# Patient Record
Sex: Female | Born: 1950 | Race: White | Hispanic: No | Marital: Married | Smoking: Never smoker
Health system: Southern US, Community
[De-identification: ages and names within clinical notes are randomized; demographics above are authoritative.]

## PROBLEM LIST (undated history)

## (undated) DIAGNOSIS — F419 Anxiety disorder, unspecified: Secondary | ICD-10-CM

## (undated) DIAGNOSIS — F32A Depression, unspecified: Secondary | ICD-10-CM

## (undated) DIAGNOSIS — T8859XA Other complications of anesthesia, initial encounter: Secondary | ICD-10-CM

## (undated) DIAGNOSIS — I10 Essential (primary) hypertension: Secondary | ICD-10-CM

## (undated) DIAGNOSIS — E7800 Pure hypercholesterolemia, unspecified: Secondary | ICD-10-CM

## (undated) DIAGNOSIS — T4145XA Adverse effect of unspecified anesthetic, initial encounter: Secondary | ICD-10-CM

## (undated) DIAGNOSIS — E039 Hypothyroidism, unspecified: Secondary | ICD-10-CM

## (undated) DIAGNOSIS — M1990 Unspecified osteoarthritis, unspecified site: Secondary | ICD-10-CM

## (undated) DIAGNOSIS — F329 Major depressive disorder, single episode, unspecified: Secondary | ICD-10-CM

## (undated) HISTORY — PX: CATARACT EXTRACTION: SUR2

---

## 2007-04-27 HISTORY — PX: JOINT REPLACEMENT: SHX530

## 2008-02-27 ENCOUNTER — Inpatient Hospital Stay (HOSPITAL_COMMUNITY): Admission: RE | Admit: 2008-02-27 | Discharge: 2008-02-29 | Payer: Self-pay | Admitting: Orthopedic Surgery

## 2010-09-08 NOTE — Op Note (Signed)
NAMETAMAKA, SAWIN               ACCOUNT NO.:  [id number]   MEDICAL RECORD NO.:  [id number]          PATIENT TYPE:  INP   LOCATION:  0002                         FACILITY:  [facility]   PHYSICIAN:  Madlyn Frankel. Charlann Boxer, M.D.  DATE OF BIRTH:  01-Jul-1950   DATE OF PROCEDURE:  02/27/2008  DATE OF DISCHARGE:                               OPERATIVE REPORT   PREOPERATIVE DIAGNOSIS:  Right hip osteoarthritis.   POSTOPERATIVE DIAGNOSIS:  Right hip osteoarthritis.   PROCEDURE:  Right total hip replacement.   COMPONENTS USED:  DePuy hip system, size 50 pinnacle cup, a 36 metal  liner, a 4 standard TriLock stem with a 36, 1.5 metal ball.   SURGEON:  Madlyn Frankel. Charlann Boxer, M.D.   ASSISTANT:  Dwyane Luo, PA.   ANESTHESIA:  General.   BLOOD LOSS:  500 mL.   SPECIMENS:  None.   DRAINS:  One medium Hemovac.   INDICATIONS FOR PROCEDURE:  Ms. Pha is a 60 year old female who  presented to the office with end-stage radiographic right hip arthritis  with projected pain to the right knee.  She had significant right medial  wall osteophytes with lateralization of the hip.  She had failed  conservative measures and at this point wished to discuss hip  replacement surgery.  The risks and benefits of the procedure were  discussed in detail.  Consent was obtained for right total hip  replacement.  The risks of infection, DVT, component failure and  dislocation were specifically outlined for hip replacement procedures.   PROCEDURE IN DETAIL:  The patient was brought to the operative theater.  Once adequate anesthesia, preoperative antibiotics, Ancef, 2 grams  administered, the patient was positioned in the left lateral decubitus  position with the right side up.  The right lower extremity was pre-  scrubbed and prepped and draped in a sterile fashion.  A lateral based  incision was made for a posterior approach to the hip.  The iliotibial  band and gluteal fascia were incised posteriorly.  The short  external  rotators were identified and taken down separate from the posterior  capsule.  An L capsulotomy was made preserving the posterior capsule to  protect the sciatic nerve from retractors within the repair at the end  of the case.  The hip was dislocated and neck osteotomy made based off  the anatomic landmarks of the center of the femoral head using a size 4  standard neck and ball.  Following the neck osteotomy, I attended to the  femur first using a box osteotome to remove some of the lateral neck,  then using the starting drill, I hand reamed it once and then irrigated  the canal to prevent fat emboli.  I began broaching with a 1 broach,  setting the anteversion at 20 degrees.  I then broached up to a size 4  and used the calcar planer to mill a little bit of the neck off  medially.   We then removed the broach and packed the femur with a sponge and  attended to the acetabulum.  Retractors were placed, the labrum removed,  and I began reaming with a 43 reamer.  I carried the reaming up to 49  reamer with good bony bed preparation.  The cup was irrigated and the  final 50 pinnacle cup was impacted at 35-40 degrees of abduction and 20  degrees of forward flexion, appearing anatomic within her cup anatomy  beneath the anterior wall anteriorly with some cup exposed  superolateral.   At this point, we placed a single cancellous screw, placed a central  hole eliminator and then impacted the final 36 metal liner.  I checked  my cup position and was happy with the overall position as noted.  Trial  reduction was now carried out with a 4 broach, standard neck.  At this  point, I was happy with the leg lengths, the amount of shuck in about a  mm with a 36, 1.5 ball.  All trial components were removed.  The final 4  standard stem was opened and impacted to the level where the broach was  and the final 36, 1.5 ball impacted onto a dry trunnion.  The hip was  reduced.  The hip was  irrigated throughout the case and again at this  point.  There was some oozing from the bone, but no significant soft  tissue oozing.  A medium Hemovac drain was placed deep.  I  reapproximated the posterior capsule with superior leaflet.  The  iliotibial band was reapproximated using #1 Vicryl.  The gluteal fascia  was closed with a #1 Vicryl running suture.  The remaining of the wound  was closed with 2-0 Vicryl and a 4-0 running Monocryl.  The hip was  cleaned, dried and dressed sterilely with a Mepilex dressing.  She was  brought to the recovery room in stable condition and tolerating the  procedure well.      Madlyn Frankel Charlann Boxer, M.D.  Electronically Signed     MDO/MEDQ  D:  02/27/2008  T:  02/27/2008  Job:  [id number]

## 2010-09-08 NOTE — H&P (Signed)
NAME:  Shelley Taylor, Shelley Taylor               ACCOUNT NO.:  [id number]   MEDICAL RECORD NO.:  [id number]          PATIENT TYPE:  INP   LOCATION:  NA                           FACILITY:  [facility]   PHYSICIAN:  Madlyn Frankel. Charlann Boxer, M.D.  DATE OF BIRTH:  03-14-1951   DATE OF ADMISSION:  02/27/2008  DATE OF DISCHARGE:                              HISTORY & PHYSICAL   PROCEDURE:  Right total hip replacement.   CHIEF COMPLAINT:  Right hip pain.   HISTORY OF PRESENT ILLNESS:  A 60 year old female with a history of  right hip pain secondary to osteoarthritis.  It has been refractory to  all conservative treatment.   PRIMARY CARE PHYSICIAN:  Dr. Joetta Manners.   PAST MEDICAL HISTORY:  Significant for:   1. Osteoarthritis.  2. Hyperlipidemia.  3. Cataracts.   PAST SURGICAL HISTORY:  None.   FAMILY MEDICAL HISTORY:  Noncontributory.   SOCIAL HISTORY:  She is married.  Has primary caregiver in the home  after surgery.   DRUG ALLERGIES:  PENICILLIN which just causes some mild itching.   MEDICATIONS:  1. Lisinopril HCTZ 20/12.5 mg 1 p.o. daily.  2. Pravachol 40 mg 1 p.o. daily.  3. Multivitamin daily.  4. Fish oil daily.   REVIEW OF SYSTEMS:  See HPI.   PHYSICAL EXAMINATION:  Pulse 72, respirations 18, blood pressure 126/74.  GENERAL:  Awake, alert, and oriented, well-developed, well-nourished, in  no acute distress.  NECK:  Supple.  There are no carotid bruits.  CHEST:  Lungs clear to auscultation bilaterally.  BREASTS:  Deferred.  HEART:  Regular rate and rhythm, S1, S2 distinct.  ABDOMEN:  Soft, nontender, nondistended, bowel sounds present.  GENITOURINARY:  Deferred.  EXTREMITIES:  Right hip has increased pain with decreased range of  motion.  SKIN:  No cellulitis.  NEUROLOGIC:  Intact distal sensibilities.   LABORATORY DATA:  EKG, chest x-ray all pending presurgical testing.   IMPRESSION:  Right hip osteoarthritis.   PLAN OF ACTION:  Right total hip replacement at [facility] on  February 27, 2008 by surgeon Dr. Lajoyce Corners.  Please see that date on  hospital  history and physical for further details.   Postoperative medication including Lovenox, Robaxin, iron, Colace, and  spironolactone were provided at time of history and physical.     ______________________________  Yetta Glassman. [address], [place]      Madlyn Frankel. Charlann Boxer, M.D.  Electronically Signed    BLM/MEDQ  D:  02/21/2008  T:  02/21/2008  Job:  [id number]   cc:   Raynelle Jan, M.D.  Fax: [phone]

## 2011-01-26 LAB — DIFFERENTIAL
Basophils Relative: 1
Eosinophils Absolute: 0.1
Eosinophils Relative: 2
Neutrophils Relative %: 53

## 2011-01-26 LAB — BASIC METABOLIC PANEL WITH GFR
BUN: 9
CO2: 26
Calcium: 7.7 — ABNORMAL LOW
Chloride: 108
Creatinine, Ser: 0.67
GFR calc Af Amer: >60
GFR calc non Af Amer: >60
Glucose, Bld: 105 — ABNORMAL HIGH
Potassium: 3.4 — ABNORMAL LOW
Sodium: 138

## 2011-01-26 LAB — PROTIME-INR: INR: 1

## 2011-01-26 LAB — URINALYSIS, ROUTINE W REFLEX MICROSCOPIC
Bilirubin Urine: NEGATIVE
Glucose, UA: NEGATIVE
Nitrite: NEGATIVE
Specific Gravity, Urine: 1.016
Urobilinogen, UA: 0.2
pH: 5.5

## 2011-01-26 LAB — CBC
HCT: 28.1 — ABNORMAL LOW
HCT: 29.1 — ABNORMAL LOW
Hemoglobin: 9.8 — ABNORMAL LOW
MCHC: 33.8
MCHC: 34.7
MCV: 95.2
MCV: 95.3
Platelets: 119 — ABNORMAL LOW
Platelets: 124 — ABNORMAL LOW
Platelets: 163
RBC: 3.05 — ABNORMAL LOW
RBC: 4.42
RDW: 12.7
WBC: 5.2

## 2011-01-26 LAB — ABO/RH: ABO/RH(D): B POS

## 2011-01-26 LAB — URINALYSIS, MICROSCOPIC ONLY
Bilirubin Urine: NEGATIVE
Ketones, ur: NEGATIVE
Leukocytes, UA: NEGATIVE

## 2011-01-26 LAB — BASIC METABOLIC PANEL
BUN: 11
BUN: 22
CO2: 26
Creatinine, Ser: 0.85
GFR calc Af Amer: >60
GFR calc non Af Amer: >60
Glucose, Bld: 117 — ABNORMAL HIGH
Potassium: 3.3 — ABNORMAL LOW

## 2011-01-26 LAB — TYPE AND SCREEN: ABO/RH(D): B POS

## 2011-01-26 LAB — URINE CULTURE: Colony Count: 9000

## 2011-01-26 LAB — APTT: aPTT: 25

## 2014-03-13 ENCOUNTER — Ambulatory Visit (HOSPITAL_COMMUNITY)
Admission: RE | Admit: 2014-03-13 | Discharge: 2014-03-13 | Disposition: A | Discharge status: Home / Self Care | Payer: BC Managed Care – PPO | Source: Ambulatory Visit | Attending: Anesthesiology | Admitting: Anesthesiology

## 2014-03-13 ENCOUNTER — Encounter (HOSPITAL_COMMUNITY): Payer: Self-pay

## 2014-03-13 ENCOUNTER — Encounter (HOSPITAL_COMMUNITY)
Admission: RE | Admit: 2014-03-13 | Discharge: 2014-03-13 | Disposition: A | Discharge status: Home / Self Care | Payer: BC Managed Care – PPO | Source: Ambulatory Visit | Attending: Orthopedic Surgery | Admitting: Orthopedic Surgery

## 2014-03-13 DIAGNOSIS — I10 Essential (primary) hypertension: Secondary | ICD-10-CM

## 2014-03-13 DIAGNOSIS — Z01818 Encounter for other preprocedural examination: Secondary | ICD-10-CM | POA: Insufficient documentation

## 2014-03-13 HISTORY — DX: Pure hypercholesterolemia, unspecified: E78.00

## 2014-03-13 HISTORY — DX: Major depressive disorder, single episode, unspecified: F32.9

## 2014-03-13 HISTORY — DX: Anxiety disorder, unspecified: F41.9

## 2014-03-13 HISTORY — DX: Essential (primary) hypertension: I10

## 2014-03-13 HISTORY — DX: Hypothyroidism, unspecified: E03.9

## 2014-03-13 HISTORY — DX: Adverse effect of unspecified anesthetic, initial encounter: T41.45XA

## 2014-03-13 HISTORY — DX: Unspecified osteoarthritis, unspecified site: M19.90

## 2014-03-13 HISTORY — DX: Other complications of anesthesia, initial encounter: T88.59XA

## 2014-03-13 HISTORY — DX: Depression, unspecified: F32.A

## 2014-03-13 LAB — BASIC METABOLIC PANEL
Anion gap: 11 (ref 5–15)
BUN: 11 mg/dL (ref 6–23)
CALCIUM: 9.7 mg/dL (ref 8.4–10.5)
CHLORIDE: 107 meq/L (ref 96–112)
CO2: 28 mEq/L (ref 19–32)
Creatinine, Ser: 0.64 mg/dL (ref 0.50–1.10)
GFR calc non Af Amer: >90 mL/min (ref >90)
Glucose, Bld: 95 mg/dL (ref 70–99)
Potassium: 4.3 mEq/L (ref 3.7–5.3)
Sodium: 146 mEq/L (ref 137–147)

## 2014-03-13 LAB — URINALYSIS, ROUTINE W REFLEX MICROSCOPIC
BILIRUBIN URINE: NEGATIVE
Glucose, UA: NEGATIVE mg/dL
Hgb urine dipstick: NEGATIVE
Ketones, ur: NEGATIVE mg/dL
Leukocytes, UA: NEGATIVE
Nitrite: NEGATIVE
PH: 7.5 (ref 5.0–8.0)
Protein, ur: NEGATIVE mg/dL
SPECIFIC GRAVITY, URINE: 1.006 (ref 1.005–1.030)
Urobilinogen, UA: 0.2 mg/dL (ref 0.0–1.0)

## 2014-03-13 LAB — SURGICAL PCR SCREEN
MRSA, PCR: NEGATIVE
STAPHYLOCOCCUS AUREUS: NEGATIVE

## 2014-03-13 LAB — PROTIME-INR
INR: 1.02 (ref 0.00–1.49)
Prothrombin Time: 13.5 seconds (ref 11.6–15.2)

## 2014-03-13 LAB — CBC
HCT: 41.4 % (ref 36.0–46.0)
HEMOGLOBIN: 13.7 g/dL (ref 12.0–15.0)
MCH: 30.9 pg (ref 26.0–34.0)
MCHC: 33.1 g/dL (ref 30.0–36.0)
MCV: 93.5 fL (ref 78.0–100.0)
Platelets: 178 10*3/uL (ref 150–400)
RBC: 4.43 MIL/uL (ref 3.87–5.11)
RDW: 12.7 % (ref 11.5–15.5)
WBC: 4.6 10*3/uL (ref 4.0–10.5)

## 2014-03-13 LAB — APTT: aPTT: 26 seconds (ref 24–37)

## 2014-03-13 NOTE — Progress Notes (Signed)
Surgery clearance note 02/25/14 Theo DillsJoyce Brown-Patrum NP on chart, LOV note 02/25/14 Theo DillsJoyce Brown-Patrum NP on chart, EKG 02/25/14 on chart

## 2014-03-13 NOTE — Patient Instructions (Addendum)
20 Shelley Taylor  03/13/2014   Your procedure is scheduled on: Tuesday 03/19/14  Report to [facility] at 06:00 AM.  Call this number if you have problems the morning of surgery [phone]   Remember:   Do not eat food or drink liquids After Midnight.     Take these medicines the morning of surgery with A SIP OF WATER: levothyroxine   Do not wear jewelry, make-up or nail polish.  Do not wear lotions, powders, or perfumes.   Do not shave 48 hours prior to surgery. Men may shave face and neck.  Do not bring valuables to the hospital.  Contacts, dentures or bridgework may not be worn into surgery.  Leave suitcase in the car. After surgery it may be brought to your room.  For patients admitted to the hospital, checkout time is 11:00 AM the day of discharge.   Please read over the following fact sheets that you were given: MRSA Information   [place] - Preparing for Surgery Before surgery, you can play an important role.  Because skin is not sterile, your skin needs to be as free of germs as possible.  You can reduce the number of germs on your skin by washing with CHG (chlorahexidine gluconate) soap before surgery.  CHG is an antiseptic cleaner which kills germs and bonds with the skin to continue killing germs even after washing. Please DO NOT use if you have an allergy to CHG or antibacterial soaps.  If your skin becomes reddened/irritated stop using the CHG and inform your nurse when you arrive at Short Stay. Do not shave (including legs and underarms) for at least 48 hours prior to the first CHG shower.  You may shave your face/neck. Please follow these instructions carefully:  1.  Shower with CHG Soap the night before surgery and the  morning of Surgery.  2.  If you choose to wash your hair, wash your hair first as usual with your  normal  shampoo.  3.  After you shampoo, rinse your hair and body thoroughly to remove the  shampoo.                             4.  Use CHG as you would any other liquid soap.  You can apply chg directly  to the skin and wash                       Gently with a scrungie or clean washcloth.  5.  Apply the CHG Soap to your body ONLY FROM THE NECK DOWN.   Do not use on face/ open                           Wound or open sores. Avoid contact with eyes, ears mouth and genitals (private parts).                       Wash face,  Genitals (private parts) with your normal soap.             6.  Wash thoroughly, paying special attention to the area where your surgery  will be performed.  7.  Thoroughly rinse your body with warm water from the neck down.  8.  DO NOT shower/wash with your normal soap after using and rinsing off  the CHG Soap.  9.  Pat yourself dry with a clean towel.            10.  Wear clean pajamas.            11.  Place clean sheets on your bed the night of your first shower and do not  sleep with pets. Day of Surgery : Do not apply any lotions/deodorants the morning of surgery.  Please wear clean clothes to the hospital/surgery center.  FAILURE TO FOLLOW THESE INSTRUCTIONS MAY RESULT IN THE CANCELLATION OF YOUR SURGERY PATIENT SIGNATURE_________________________________  NURSE SIGNATURE__________________________________  ________________________________________________________________________  WHAT IS A BLOOD TRANSFUSION? Blood Transfusion Information  A transfusion is the replacement of blood or some of its parts. Blood is made up of multiple cells which provide different functions.  Red blood cells carry oxygen and are used for blood loss replacement.  White blood cells fight against infection.  Platelets control bleeding.  Plasma helps clot blood.  Other blood products are available for specialized needs, such as hemophilia or other clotting disorders. BEFORE THE TRANSFUSION  Who gives blood for transfusions?   Healthy volunteers who are fully evaluated to make sure their blood  is safe. This is blood bank blood. Transfusion therapy is the safest it has ever been in the practice of medicine. Before blood is taken from a donor, a complete history is taken to make sure that person has no history of diseases nor engages in risky social behavior (examples are intravenous drug use or sexual activity with multiple partners). The donor's travel history is screened to minimize risk of transmitting infections, such as malaria. The donated blood is tested for signs of infectious diseases, such as HIV and hepatitis. The blood is then tested to be sure it is compatible with you in order to minimize the chance of a transfusion reaction. If you or a relative donates blood, this is often done in anticipation of surgery and is not appropriate for emergency situations. It takes many days to process the donated blood. RISKS AND COMPLICATIONS Although transfusion therapy is very safe and saves many lives, the main dangers of transfusion include:  1. Getting an infectious disease. 2. Developing a transfusion reaction. This is an allergic reaction to something in the blood you were given. Every precaution is taken to prevent this. The decision to have a blood transfusion has been considered carefully by your caregiver before blood is given. Blood is not given unless the benefits outweigh the risks. AFTER THE TRANSFUSION  Right after receiving a blood transfusion, you will usually feel much better and more energetic. This is especially true if your red blood cells have gotten low (anemic). The transfusion raises the level of the red blood cells which carry oxygen, and this usually causes an energy increase.  The nurse administering the transfusion will monitor you carefully for complications. HOME CARE INSTRUCTIONS  No special instructions are needed after a transfusion. You may find your energy is better. Speak with your caregiver about any limitations on activity for underlying diseases you may  have. SEEK MEDICAL CARE IF:   Your condition is not improving after your transfusion.  You develop redness or irritation at the intravenous (IV) site. SEEK IMMEDIATE MEDICAL CARE IF:  Any of the following symptoms occur over the next 12 hours:  Shaking chills.  You have a temperature by mouth above 102 F (38.9 C), not controlled by medicine.  Chest, back, or muscle pain.  People around you feel you are not acting correctly or  are confused.  Shortness of breath or difficulty breathing.  Dizziness and fainting.  You get a rash or develop hives.  You have a decrease in urine output.  Your urine turns a dark color or changes to pink, red, or brown. Any of the following symptoms occur over the next 10 days:  You have a temperature by mouth above 102 F (38.9 C), not controlled by medicine.  Shortness of breath.  Weakness after normal activity.  The white part of the eye turns yellow (jaundice).  You have a decrease in the amount of urine or are urinating less often.  Your urine turns a dark color or changes to pink, red, or brown. Document Released: 04/09/2000 Document Revised: 07/05/2011 Document Reviewed: 11/27/2007 [employer] Patient Information 2014 [employer] [place].  _______________________________________________________________________  Incentive Spirometer  An incentive spirometer is a tool that can help keep your lungs clear and active. This tool measures how well you are filling your lungs with each breath. Taking long deep breaths may help reverse or decrease the chance of developing breathing (pulmonary) problems (especially infection) following:  A long period of time when you are unable to move or be active. BEFORE THE PROCEDURE   If the spirometer includes an indicator to show your best effort, your nurse or respiratory therapist will set it to a desired goal.  If possible, sit up straight or lean slightly forward. Try not to slouch.  Hold the  incentive spirometer in an upright position. INSTRUCTIONS FOR USE  3. Sit on the edge of your bed if possible, or sit up as far as you can in bed or on a chair. 4. Hold the incentive spirometer in an upright position. 5. Breathe out normally. 6. Place the mouthpiece in your mouth and seal your lips tightly around it. 7. Breathe in slowly and as deeply as possible, raising the piston or the ball toward the top of the column. 8. Hold your breath for 3-5 seconds or for as long as possible. Allow the piston or ball to fall to the bottom of the column. 9. Remove the mouthpiece from your mouth and breathe out normally. 10. Rest for a few seconds and repeat Steps 1 through 7 at least 10 times every 1-2 hours when you are awake. Take your time and take a few normal breaths between deep breaths. 11. The spirometer may include an indicator to show your best effort. Use the indicator as a goal to work toward during each repetition. 12. After each set of 10 deep breaths, practice coughing to be sure your lungs are clear. If you have an incision (the cut made at the time of surgery), support your incision when coughing by placing a pillow or rolled up towels firmly against it. Once you are able to get out of bed, walk around indoors and cough well. You may stop using the incentive spirometer when instructed by your caregiver.  RISKS AND COMPLICATIONS  Take your time so you do not get dizzy or light-headed.  If you are in pain, you may need to take or ask for pain medication before doing incentive spirometry. It is harder to take a deep breath if you are having pain. AFTER USE  Rest and breathe slowly and easily.  It can be helpful to keep track of a log of your progress. Your caregiver can provide you with a simple table to help with this. If you are using the spirometer at home, follow these instructions: SEEK MEDICAL CARE IF:  You are having difficultly using the spirometer.  You have trouble using  the spirometer as often as instructed.  Your pain medication is not giving enough relief while using the spirometer.  You develop fever of 100.5 F (38.1 C) or higher. SEEK IMMEDIATE MEDICAL CARE IF:   You cough up bloody sputum that had not been present before.  You develop fever of 102 F (38.9 C) or greater.  You develop worsening pain at or near the incision site. MAKE SURE YOU:   Understand these instructions.  Will watch your condition.  Will get help right away if you are not doing well or get worse. Document Released: 08/23/2006 Document Revised: 07/05/2011 Document Reviewed: 10/24/2006 [facility] Patient Information 2014 [place], [place].   ________________________________________________________________________

## 2014-03-17 NOTE — H&P (Signed)
TOTAL HIP ADMISSION H&P  Patient is admitted for left total hip arthroplasty, anterior approach.  Subjective:  Chief Complaint:     Left hip primary OA / pain  HPI: Shelley Taylor, 63 y.o. female, has a history of pain and functional disability in the left hip(s) due to arthritis and patient has failed non-surgical conservative treatments for greater than 12 weeks to include NSAID's and/or analgesics, corticosteriod injections and activity modification.  Onset of symptoms was gradual starting < 1 year ago with gradually worsening course since that time.The patient noted prior procedures of the hip to include arthroplasty on the right hip per Dr. Charlann Boxerlin in 2009.  Patient currently rates pain in the left hip at 10 out of 10 with activity. Patient has night pain, worsening of pain with activity and weight bearing, trendelenberg gait, pain that interfers with activities of daily living and pain with passive range of motion. Patient has evidence of periarticular osteophytes and joint space narrowing by imaging studies. This condition presents safety issues increasing the risk of falls.   There is no current active infection.  Risks, benefits and expectations were discussed with the patient.  Risks including but not limited to the risk of anesthesia, blood clots, nerve damage, blood vessel damage, failure of the prosthesis, infection and up to and including death.  Patient understand the risks, benefits and expectations and wishes to proceed with surgery.   PCP: No primary care provider on file.  D/C Plans:      Home with HHPT  Post-op Meds:       No Rx given  Tranexamic Acid:      To be given - IV    Decadron:      Is to be given  FYI:     ASA post-op  Norco post-op    Past Medical History  Diagnosis Date  . Hypertension     "white coat syndrome from one HTN episode in 2009, bottomed out after hip surgery 2009"  . Hypercholesteremia     under control  . Hypothyroidism   . Anxiety    occasional  . Depression     occasional-seasonal  . Arthritis   . Complication of anesthesia     blood pressure dropped-maybe from pain medications or anesthesia    Past Surgical History  Procedure Laterality Date  . Joint replacement Right 2009    hip  . Cataract extraction Bilateral Oct, Dec 1997    No prescriptions prior to admission   Allergies  Allergen Reactions  . Amoxicillin Swelling  . Septra [Sulfamethoxazole-Trimethoprim] Swelling    History  Substance Use Topics  . Smoking status: Never Smoker   . Smokeless tobacco: Never Used  . Alcohol Use: No    No family history on file.   Review of Systems  Constitutional: Negative.   HENT: Negative.   Eyes: Negative.   Respiratory: Negative.   Cardiovascular: Negative.   Gastrointestinal: Positive for constipation.  Genitourinary: Negative.   Musculoskeletal: Positive for joint pain.  Skin: Negative.   Neurological: Negative.   Endo/Heme/Allergies: Negative.   Psychiatric/Behavioral: Positive for depression. The patient is nervous/anxious.     Objective:  Physical Exam  Constitutional: She is oriented to person, place, and time. She appears well-developed and well-nourished.  HENT:  Head: Normocephalic and atraumatic.  Eyes: Pupils are equal, round, and reactive to light.  Neck: Neck supple. No JVD present. No tracheal deviation present. No thyromegaly present.  Cardiovascular: Normal rate, regular rhythm, normal heart sounds and  intact distal pulses.   Respiratory: Effort normal and breath sounds normal. No stridor. No respiratory distress. She has no wheezes.  GI: Soft. There is no tenderness. There is no guarding.  Musculoskeletal:       Left hip: She exhibits decreased range of motion, decreased strength, tenderness and bony tenderness. She exhibits no swelling, no deformity and no laceration.  Lymphadenopathy:    She has no cervical adenopathy.  Neurological: She is alert and oriented to person, place,  and time.  Skin: Skin is warm and dry.  Psychiatric: She has a normal mood and affect.      Imaging Review Plain radiographs demonstrate severe degenerative joint disease of the left hip(s). The bone quality appears to be good for age and reported activity level.  Assessment/Plan:  End stage arthritis, left hip(s)  The patient history, physical examination, clinical judgement of the provider and imaging studies are consistent with end stage degenerative joint disease of the left hip(s) and total hip arthroplasty is deemed medically necessary. The treatment options including medical management, injection therapy, arthroscopy and arthroplasty were discussed at length. The risks and benefits of total hip arthroplasty were presented and reviewed. The risks due to aseptic loosening, infection, stiffness, dislocation/subluxation,  thromboembolic complications and other imponderables were discussed.  The patient acknowledged the explanation, agreed to proceed with the plan and consent was signed. Patient is being admitted for inpatient treatment for surgery, pain control, PT, OT, prophylactic antibiotics, VTE prophylaxis, progressive ambulation and ADL's and discharge planning.The patient is planning to be discharged home with home health services.      Anastasio AuerbachMatthew S. Shaquelle Hernon   PA-C  03/17/2014, 10:33 PM

## 2014-03-19 ENCOUNTER — Inpatient Hospital Stay (HOSPITAL_COMMUNITY): Payer: BC Managed Care – PPO

## 2014-03-19 ENCOUNTER — Inpatient Hospital Stay (HOSPITAL_COMMUNITY): Payer: BC Managed Care – PPO | Admitting: Anesthesiology

## 2014-03-19 ENCOUNTER — Encounter (HOSPITAL_COMMUNITY)
Admission: RE | Disposition: A | Discharge status: Home Health | Payer: Self-pay | Source: Ambulatory Visit | Attending: Orthopedic Surgery

## 2014-03-19 ENCOUNTER — Encounter (HOSPITAL_COMMUNITY): Payer: Self-pay | Admitting: *Deleted

## 2014-03-19 ENCOUNTER — Inpatient Hospital Stay (HOSPITAL_COMMUNITY)
Admission: RE | Admit: 2014-03-19 | Discharge: 2014-03-20 | DRG: 470 | Disposition: A | Discharge status: Home Health | Payer: BC Managed Care – PPO | Source: Ambulatory Visit | Attending: Orthopedic Surgery | Admitting: Orthopedic Surgery

## 2014-03-19 DIAGNOSIS — Z6841 Body Mass Index (BMI) 40.0 and over, adult: Secondary | ICD-10-CM | POA: Diagnosis not present

## 2014-03-19 DIAGNOSIS — M25552 Pain in left hip: Secondary | ICD-10-CM | POA: Diagnosis present

## 2014-03-19 DIAGNOSIS — E039 Hypothyroidism, unspecified: Secondary | ICD-10-CM | POA: Diagnosis present

## 2014-03-19 DIAGNOSIS — Z96649 Presence of unspecified artificial hip joint: Secondary | ICD-10-CM

## 2014-03-19 DIAGNOSIS — M1612 Unilateral primary osteoarthritis, left hip: Principal | ICD-10-CM | POA: Diagnosis present

## 2014-03-19 DIAGNOSIS — Z96641 Presence of right artificial hip joint: Secondary | ICD-10-CM | POA: Diagnosis present

## 2014-03-19 DIAGNOSIS — I10 Essential (primary) hypertension: Secondary | ICD-10-CM | POA: Diagnosis present

## 2014-03-19 DIAGNOSIS — E780 Pure hypercholesterolemia: Secondary | ICD-10-CM | POA: Diagnosis present

## 2014-03-19 HISTORY — PX: TOTAL HIP ARTHROPLASTY: SHX124

## 2014-03-19 LAB — TYPE AND SCREEN
ABO/RH(D): B POS
ANTIBODY SCREEN: NEGATIVE

## 2014-03-19 SURGERY — ARTHROPLASTY, HIP, TOTAL, ANTERIOR APPROACH
Anesthesia: Spinal | Site: Hip | Laterality: Left

## 2014-03-19 MED ORDER — METHOCARBAMOL 500 MG PO TABS
500.0000 mg | ORAL_TABLET | Freq: Four times a day (QID) | ORAL | Status: DC | PRN
  Administered 2014-03-19 – 2014-03-20 (×3): 500 mg via ORAL
  Filled 2014-03-19 (×3): qty 1

## 2014-03-19 MED ORDER — ASPIRIN EC 325 MG PO TBEC
325.0000 mg | DELAYED_RELEASE_TABLET | Freq: Two times a day (BID) | ORAL | Status: DC
  Administered 2014-03-20: 325 mg via ORAL
  Filled 2014-03-19 (×3): qty 1

## 2014-03-19 MED ORDER — LIDOCAINE HCL (CARDIAC) 20 MG/ML IV SOLN
INTRAVENOUS | Status: AC
  Filled 2014-03-19: qty 5

## 2014-03-19 MED ORDER — DEXAMETHASONE SODIUM PHOSPHATE 10 MG/ML IJ SOLN
10.0000 mg | Freq: Once | INTRAMUSCULAR | Status: AC
  Administered 2014-03-20: 10 mg via INTRAVENOUS
  Filled 2014-03-19: qty 1

## 2014-03-19 MED ORDER — ONDANSETRON HCL 4 MG/2ML IJ SOLN
INTRAMUSCULAR | Status: DC | PRN
  Administered 2014-03-19: 4 mg via INTRAVENOUS

## 2014-03-19 MED ORDER — DIPHENHYDRAMINE HCL 25 MG PO CAPS: 25.0000 mg | ORAL_CAPSULE | Freq: Four times a day (QID) | ORAL | Status: DC | PRN

## 2014-03-19 MED ORDER — PROPOFOL INFUSION 10 MG/ML OPTIME
INTRAVENOUS | Status: DC | PRN
  Administered 2014-03-19: 100 ug/kg/min via INTRAVENOUS

## 2014-03-19 MED ORDER — HYDROMORPHONE HCL 1 MG/ML IJ SOLN
INTRAMUSCULAR | Status: AC
  Administered 2014-03-19: 0.5 mg via INTRAVENOUS
  Filled 2014-03-19: qty 1

## 2014-03-19 MED ORDER — METOCLOPRAMIDE HCL 10 MG PO TABS: 5.0000 mg | ORAL_TABLET | Freq: Three times a day (TID) | ORAL | Status: DC | PRN

## 2014-03-19 MED ORDER — POTASSIUM CHLORIDE 2 MEQ/ML IV SOLN
100.0000 mL/h | INTRAVENOUS | Status: DC
  Administered 2014-03-19 (×2): 100 mL/h via INTRAVENOUS
  Filled 2014-03-19 (×9): qty 1000

## 2014-03-19 MED ORDER — ONDANSETRON HCL 4 MG/2ML IJ SOLN: 4.0000 mg | Freq: Four times a day (QID) | INTRAMUSCULAR | Status: DC | PRN

## 2014-03-19 MED ORDER — CEFAZOLIN SODIUM-DEXTROSE 2-3 GM-% IV SOLR
INTRAVENOUS | Status: AC
  Filled 2014-03-19: qty 50

## 2014-03-19 MED ORDER — BISACODYL 10 MG RE SUPP: 10.0000 mg | Freq: Every day | RECTAL | Status: DC | PRN

## 2014-03-19 MED ORDER — DOCUSATE SODIUM 100 MG PO CAPS
100.0000 mg | ORAL_CAPSULE | Freq: Two times a day (BID) | ORAL | Status: DC
  Administered 2014-03-19 – 2014-03-20 (×2): 100 mg via ORAL

## 2014-03-19 MED ORDER — LACTATED RINGERS IV SOLN
INTRAVENOUS | Status: DC | PRN
  Administered 2014-03-19 (×3): via INTRAVENOUS

## 2014-03-19 MED ORDER — MAGNESIUM CITRATE PO SOLN: 1.0000 | Freq: Once | ORAL | Status: AC | PRN

## 2014-03-19 MED ORDER — DEXAMETHASONE SODIUM PHOSPHATE 10 MG/ML IJ SOLN
INTRAMUSCULAR | Status: AC
  Filled 2014-03-19: qty 1

## 2014-03-19 MED ORDER — PHENYLEPHRINE HCL 10 MG/ML IJ SOLN
INTRAMUSCULAR | Status: AC
  Filled 2014-03-19: qty 1

## 2014-03-19 MED ORDER — PROMETHAZINE HCL 25 MG/ML IJ SOLN: 6.2500 mg | INTRAMUSCULAR | Status: DC | PRN

## 2014-03-19 MED ORDER — LACTATED RINGERS IV SOLN
INTRAVENOUS | Status: DC
  Administered 2014-03-19: 1000 mL via INTRAVENOUS

## 2014-03-19 MED ORDER — PROPOFOL 10 MG/ML IV EMUL
INTRAVENOUS | Status: DC | PRN
  Administered 2014-03-19: 20 mg via INTRAVENOUS

## 2014-03-19 MED ORDER — MIDAZOLAM HCL 5 MG/5ML IJ SOLN
INTRAMUSCULAR | Status: DC | PRN
  Administered 2014-03-19: 2 mg via INTRAVENOUS

## 2014-03-19 MED ORDER — CEFAZOLIN SODIUM-DEXTROSE 2-3 GM-% IV SOLR
2.0000 g | INTRAVENOUS | Status: AC
  Administered 2014-03-19: 2 g via INTRAVENOUS

## 2014-03-19 MED ORDER — METOCLOPRAMIDE HCL 5 MG/ML IJ SOLN: 5.0000 mg | Freq: Three times a day (TID) | INTRAMUSCULAR | Status: DC | PRN

## 2014-03-19 MED ORDER — MIDAZOLAM HCL 2 MG/2ML IJ SOLN
INTRAMUSCULAR | Status: AC
  Filled 2014-03-19: qty 2

## 2014-03-19 MED ORDER — ONDANSETRON HCL 4 MG/2ML IJ SOLN
INTRAMUSCULAR | Status: AC
  Filled 2014-03-19: qty 2

## 2014-03-19 MED ORDER — PROPOFOL 10 MG/ML IV BOLUS
INTRAVENOUS | Status: AC
  Filled 2014-03-19: qty 20

## 2014-03-19 MED ORDER — KETOROLAC TROMETHAMINE 15 MG/ML IJ SOLN
7.5000 mg | Freq: Four times a day (QID) | INTRAMUSCULAR | Status: DC
  Administered 2014-03-19 (×2): 7.5 mg via INTRAVENOUS
  Filled 2014-03-19 (×2): qty 1

## 2014-03-19 MED ORDER — HYDROMORPHONE HCL 1 MG/ML IJ SOLN
0.2500 mg | INTRAMUSCULAR | Status: DC | PRN
  Administered 2014-03-19 (×2): 0.5 mg via INTRAVENOUS

## 2014-03-19 MED ORDER — EPHEDRINE SULFATE 50 MG/ML IJ SOLN
INTRAMUSCULAR | Status: AC
  Filled 2014-03-19: qty 1

## 2014-03-19 MED ORDER — PHENOL 1.4 % MT LIQD: 1.0000 | OROMUCOSAL | Status: DC | PRN

## 2014-03-19 MED ORDER — TRANEXAMIC ACID 100 MG/ML IV SOLN
1000.0000 mg | Freq: Once | INTRAVENOUS | Status: AC
  Administered 2014-03-19: 1000 mg via INTRAVENOUS
  Filled 2014-03-19: qty 10

## 2014-03-19 MED ORDER — LEVOTHYROXINE SODIUM 50 MCG PO TABS
50.0000 ug | ORAL_TABLET | Freq: Every day | ORAL | Status: DC
  Administered 2014-03-20: 50 ug via ORAL
  Filled 2014-03-19 (×2): qty 1

## 2014-03-19 MED ORDER — ALUM & MAG HYDROXIDE-SIMETH 200-200-20 MG/5ML PO SUSP: 30.0000 mL | ORAL | Status: DC | PRN

## 2014-03-19 MED ORDER — HYDROCODONE-ACETAMINOPHEN 7.5-325 MG PO TABS
1.0000 | ORAL_TABLET | ORAL | Status: DC
  Administered 2014-03-19: 2 via ORAL
  Administered 2014-03-19: 1 via ORAL
  Administered 2014-03-19 – 2014-03-20 (×5): 2 via ORAL
  Filled 2014-03-19: qty 2
  Filled 2014-03-19: qty 1
  Filled 2014-03-19 (×5): qty 2

## 2014-03-19 MED ORDER — MENTHOL 3 MG MT LOZG
1.0000 | LOZENGE | OROMUCOSAL | Status: DC | PRN
  Filled 2014-03-19: qty 9

## 2014-03-19 MED ORDER — 0.9 % SODIUM CHLORIDE (POUR BTL) OPTIME
TOPICAL | Status: DC | PRN
  Administered 2014-03-19: 1000 mL

## 2014-03-19 MED ORDER — BUPIVACAINE IN DEXTROSE 0.75-8.25 % IT SOLN
INTRATHECAL | Status: DC | PRN
  Administered 2014-03-19: 2 mL via INTRATHECAL

## 2014-03-19 MED ORDER — ONDANSETRON HCL 4 MG PO TABS: 4.0000 mg | ORAL_TABLET | Freq: Four times a day (QID) | ORAL | Status: DC | PRN

## 2014-03-19 MED ORDER — CEFAZOLIN SODIUM-DEXTROSE 2-3 GM-% IV SOLR
2.0000 g | Freq: Four times a day (QID) | INTRAVENOUS | Status: AC
  Administered 2014-03-19 (×2): 2 g via INTRAVENOUS
  Filled 2014-03-19 (×2): qty 50

## 2014-03-19 MED ORDER — DEXAMETHASONE SODIUM PHOSPHATE 10 MG/ML IJ SOLN
10.0000 mg | Freq: Once | INTRAMUSCULAR | Status: AC
  Administered 2014-03-19: 10 mg via INTRAVENOUS

## 2014-03-19 MED ORDER — HYDROMORPHONE HCL 1 MG/ML IJ SOLN
0.5000 mg | INTRAMUSCULAR | Status: DC | PRN
  Administered 2014-03-19: 1 mg via INTRAVENOUS
  Administered 2014-03-19 (×2): 0.5 mg via INTRAVENOUS
  Filled 2014-03-19 (×3): qty 1

## 2014-03-19 MED ORDER — CITALOPRAM HYDROBROMIDE 10 MG PO TABS
10.0000 mg | ORAL_TABLET | Freq: Every evening | ORAL | Status: DC
  Administered 2014-03-19: 10 mg via ORAL
  Filled 2014-03-19 (×2): qty 1

## 2014-03-19 MED ORDER — PRAVASTATIN SODIUM 40 MG PO TABS
40.0000 mg | ORAL_TABLET | Freq: Every day | ORAL | Status: DC
  Administered 2014-03-19: 40 mg via ORAL
  Filled 2014-03-19 (×2): qty 1

## 2014-03-19 MED ORDER — METHOCARBAMOL 1000 MG/10ML IJ SOLN
500.0000 mg | Freq: Four times a day (QID) | INTRAVENOUS | Status: DC | PRN
  Administered 2014-03-19: 500 mg via INTRAVENOUS
  Filled 2014-03-19 (×2): qty 5

## 2014-03-19 MED ORDER — POLYETHYLENE GLYCOL 3350 17 G PO PACK
17.0000 g | PACK | Freq: Two times a day (BID) | ORAL | Status: DC
  Administered 2014-03-20: 17 g via ORAL

## 2014-03-19 MED ORDER — PHENYLEPHRINE HCL 10 MG/ML IJ SOLN
10.0000 mg | INTRAVENOUS | Status: DC | PRN
  Administered 2014-03-19: 10 ug/min via INTRAVENOUS

## 2014-03-19 MED ORDER — FERROUS SULFATE 325 (65 FE) MG PO TABS
325.0000 mg | ORAL_TABLET | Freq: Three times a day (TID) | ORAL | Status: DC
  Administered 2014-03-20: 325 mg via ORAL
  Filled 2014-03-19 (×5): qty 1

## 2014-03-19 MED ORDER — SODIUM CHLORIDE 0.9 % IJ SOLN
INTRAMUSCULAR | Status: AC
  Filled 2014-03-19: qty 10

## 2014-03-19 SURGICAL SUPPLY — 38 items
BAG ZIPLOCK 12X15 (MISCELLANEOUS) IMPLANT
CAPT HIP PF COP ×3 IMPLANT
COVER PERINEAL POST (MISCELLANEOUS) ×3 IMPLANT
DRAPE C-ARM 42X120 X-RAY (DRAPES) ×3 IMPLANT
DRAPE STERI IOBAN 125X83 (DRAPES) ×3 IMPLANT
DRAPE U-SHAPE 47X51 STRL (DRAPES) ×9 IMPLANT
DRSG AQUACEL AG ADV 3.5X10 (GAUZE/BANDAGES/DRESSINGS) ×3 IMPLANT
DURAPREP 26ML APPLICATOR (WOUND CARE) ×3 IMPLANT
ELECT BLADE TIP CTD 4 INCH (ELECTRODE) ×3 IMPLANT
ELECT REM PT RETURN 9FT ADLT (ELECTROSURGICAL) ×3
ELECTRODE REM PT RTRN 9FT ADLT (ELECTROSURGICAL) ×1 IMPLANT
FACESHIELD WRAPAROUND (MASK) ×12 IMPLANT
GLOVE BIO SURGEON STRL SZ7 (GLOVE) ×3 IMPLANT
GLOVE BIO SURGEON STRL SZ7.5 (GLOVE) ×3 IMPLANT
GLOVE BIOGEL M 7.0 STRL (GLOVE) ×3 IMPLANT
GLOVE BIOGEL PI IND STRL 6.5 (GLOVE) ×1 IMPLANT
GLOVE BIOGEL PI IND STRL 7.0 (GLOVE) ×1 IMPLANT
GLOVE BIOGEL PI IND STRL 7.5 (GLOVE) ×2 IMPLANT
GLOVE BIOGEL PI INDICATOR 6.5 (GLOVE) ×2
GLOVE BIOGEL PI INDICATOR 7.0 (GLOVE) ×2
GLOVE BIOGEL PI INDICATOR 7.5 (GLOVE) ×4
GLOVE ORTHO TXT STRL SZ7.5 (GLOVE) ×3 IMPLANT
GOWN STRL REUS W/TWL LRG LVL3 (GOWN DISPOSABLE) ×3 IMPLANT
GOWN STRL REUS W/TWL XL LVL3 (GOWN DISPOSABLE) ×9 IMPLANT
HOLDER FOLEY CATH W/STRAP (MISCELLANEOUS) ×3 IMPLANT
KIT BASIN OR (CUSTOM PROCEDURE TRAY) ×3 IMPLANT
LIQUID BAND (GAUZE/BANDAGES/DRESSINGS) ×3 IMPLANT
PACK TOTAL JOINT (CUSTOM PROCEDURE TRAY) ×3 IMPLANT
SAW OSC TIP CART 19.5X105X1.3 (SAW) ×3 IMPLANT
SUT MNCRL AB 4-0 PS2 18 (SUTURE) ×3 IMPLANT
SUT VIC AB 1 CT1 36 (SUTURE) ×9 IMPLANT
SUT VIC AB 2-0 CT1 27 (SUTURE) ×6
SUT VIC AB 2-0 CT1 TAPERPNT 27 (SUTURE) ×3 IMPLANT
SUT VLOC 180 0 24IN GS25 (SUTURE) ×3 IMPLANT
TOWEL OR 17X26 10 PK STRL BLUE (TOWEL DISPOSABLE) ×3 IMPLANT
TOWEL OR NON WOVEN STRL DISP B (DISPOSABLE) ×3 IMPLANT
TRAY FOLEY CATH 14FRSI W/METER (CATHETERS) ×3 IMPLANT
WATER STERILE IRR 1500ML POUR (IV SOLUTION) ×3 IMPLANT

## 2014-03-19 NOTE — Plan of Care (Signed)
Problem: Phase I Progression Outcomes Goal: Pain controlled with appropriate interventions Outcome: Completed/Met Date Met:  03/19/14     

## 2014-03-19 NOTE — Anesthesia Procedure Notes (Signed)

## 2014-03-19 NOTE — Transfer of Care (Signed)
Immediate Anesthesia Transfer of Care Note  Patient: Shelley Taylor  Procedure(s) Performed: Procedure(s): LEFT TOTAL HIP ARTHROPLASTY ANTERIOR APPROACH (Left)  Patient Location: PACU  Anesthesia Type:Spinal  Level of Consciousness: awake, alert  and oriented  Airway & Oxygen Therapy: Patient Spontanous Breathing and Patient connected to face mask oxygen  Post-op Assessment: Report given to PACU RN and Post -op Vital signs reviewed and stable  Post vital signs: Reviewed and stable  Complications: No apparent anesthesia complications

## 2014-03-19 NOTE — Plan of Care (Signed)
Problem: Phase I Progression Outcomes Goal: Dangle or out of bed evening of surgery Outcome: Completed/Met Date Met:  03/19/14     

## 2014-03-19 NOTE — Interval H&P Note (Signed)
History and Physical Interval Note:  03/19/2014 6:31 AM  Shelley Taylor  has presented today for surgery, with the diagnosis of LEFT HIP OA  The various methods of treatment have been discussed with the patient and family. After consideration of risks, benefits and other options for treatment, the patient has consented to  Procedure(s): LEFT TOTAL HIP ARTHROPLASTY ANTERIOR APPROACH (Left) as a surgical intervention .  The patient's history has been reviewed, patient examined, no change in status, stable for surgery.  I have reviewed the patient's chart and labs.  Questions were answered to the patient's satisfaction.     Shelda PalLIN,Lisandra Mathisen D

## 2014-03-19 NOTE — Evaluation (Signed)
Physical Therapy Evaluation Patient Details Name: Shelley PerlaCarol B Taylor MRN: [id number] DOB: 10/15/1950 Today's Date: 03/19/2014   History of Present Illness  Pt is a 63 year old female s/p L direct anterior THA  Clinical Impression  Pt is s/p L THA resulting in the deficits listed below (see PT Problem List).  Pt will benefit from skilled PT to increase their independence and safety with mobility to allow discharge to the venue listed below.  Pt agreeable to mobilize POD #0 and ambulated short distance to tolerance then assisted to recliner.  Pt plans to d/c home.        Follow Up Recommendations Home health PT    Equipment Recommendations  None recommended by PT    Recommendations for Other Services       Precautions / Restrictions Precautions Precautions: None Restrictions Other Position/Activity Restrictions: WBAT      Mobility  Bed Mobility Overal bed mobility: Needs Assistance Bed Mobility: Supine to Sit     Supine to sit: Min assist     General bed mobility comments: verbal cues for technique, assist for L LE  Transfers Overall transfer level: Needs assistance Equipment used: Rolling walker (2 wheeled) Transfers: Sit to/from Stand Sit to Stand: Min assist         General transfer comment: verbal cues for safe technique  Ambulation/Gait Ambulation/Gait assistance: Min guard Ambulation Distance (Feet): 18 Feet Assistive device: Rolling walker (2 wheeled) Gait Pattern/deviations: Step-to pattern;Antalgic     General Gait Details: verbal cues for sequence, RW positioning, posture  Stairs            Wheelchair Mobility    Modified Rankin (Stroke Patients Only)       Balance                                             Pertinent Vitals/Pain Pain Assessment: No/denies pain (premedicated, ice applied end of session)    Home Living Family/patient expects to be discharged to:: Private residence Living Arrangements:  Spouse/significant other Available Help at Discharge: Family Type of Home: House Home Access: Ramped entrance     Home Layout: One level Home Equipment: Environmental consultantWalker - 2 wheels      Prior Function Level of Independence: Independent               Hand Dominance        Extremity/Trunk Assessment               Lower Extremity Assessment: LLE deficits/detail   LLE Deficits / Details: decreased functional hip strength per observation, required assist for bed mobility     Communication   Communication: No difficulties  Cognition Arousal/Alertness: Awake/alert Behavior During Therapy: WFL for tasks assessed/performed Overall Cognitive Status: Within Functional Limits for tasks assessed                      General Comments      Exercises        Assessment/Plan    PT Assessment Patient needs continued PT services  PT Diagnosis Difficulty walking;Acute pain   PT Problem List Decreased strength;Decreased activity tolerance;Decreased mobility;Pain  PT Treatment Interventions Functional mobility training;Gait training;DME instruction;Patient/family education;Therapeutic activities;Therapeutic exercise   PT Goals (Current goals can be found in the Care Plan section) Acute Rehab PT Goals PT Goal Formulation: With patient Time For Goal Achievement: 03/23/14  Potential to Achieve Goals: Good    Frequency 7X/week   Barriers to discharge        Co-evaluation               End of Session   Activity Tolerance: Patient limited by fatigue Patient left: with family/visitor present;with call bell/phone within reach;in chair Nurse Communication: Mobility status         Time: [id number] PT Time Calculation (min) (ACUTE ONLY): 14 min   Charges:   PT Evaluation $Initial PT Evaluation Tier I: 1 Procedure PT Treatments $Gait Training: 8-22 mins   PT G Codes:          Jayshon Dommer,KATHrine E 03/19/2014, 3:51 PM Zenovia JarredKati Monserath Neff, PT, DPT 03/19/2014 Pager:  [phone]

## 2014-03-19 NOTE — Plan of Care (Signed)
Problem: Phase I Progression Outcomes Goal: CMS/Neurovascular status WDL Outcome: Completed/Met Date Met:  03/19/14

## 2014-03-19 NOTE — Op Note (Signed)
NAME:  Shelley Taylor                ACCOUNT NO.: [id number]      MEDICAL RECORD NO.: [id number]      [facility]      PHYSICIAN:  Durene RomansLIN,Larkyn Greenberger D  DATE OF BIRTH:  08/23/1950     DATE OF PROCEDURE:  03/19/2014                                 OPERATIVE REPORT         PREOPERATIVE DIAGNOSIS: Left  hip osteoarthritis.      POSTOPERATIVE DIAGNOSIS:  Left hip osteoarthritis.      PROCEDURE:  Left total hip replacement through an anterior approach   utilizing DePuy THR system, component size 50mm pinnacle cup, a size 32+4 neutral   Altrex liner, a size 4 standard Tri Lock stem with a 32+1 delta ceramic   ball.      SURGEON:  Madlyn FrankelMatthew D. Charlann Boxerlin, M.D.      ASSISTANT:  Skip MayerBlair Roberts, PA-C      ANESTHESIA:  Spinal.      SPECIMENS:  None.      COMPLICATIONS:  None.      BLOOD LOSS:  400 cc     DRAINS:  None.      INDICATION OF THE PROCEDURE:  Shelley PerlaCarol B Speyer is a 63 y.o. female who had   presented to office for evaluation of left hip pain.  Radiographs revealed   progressive degenerative changes with bone-on-bone   articulation to the  hip joint.  The patient had painful limited range of   motion significantly affecting their overall quality of life.  The patient was failing to    respond to conservative measures, and at this point was ready   to proceed with more definitive measures.  The patient has noted progressive   degenerative changes in his hip, progressive problems and dysfunction   with regarding the hip prior to surgery.  Consent was obtained for   benefit of pain relief.  Specific risk of infection, DVT, component   failure, dislocation, need for revision surgery, as well discussion of   the anterior versus posterior approach were reviewed.  Consent was   obtained for benefit of anterior pain relief through an anterior   approach.   She has a history of a right total hip replacement about 4 years ago.  At that time we performed hip  replacement with metal on metal modular components.  Based on availability and options for her age group I elected and discussed with her the options of ceramic on polyethylene.       PROCEDURE IN DETAIL:  The patient was brought to operative theater.   Once adequate anesthesia, preoperative antibiotics, 2gm of Ancef administered.   The patient was positioned supine on the OSI Hanna table.  Once adequate   padding of boney process was carried out, we had predraped out the hip, and  used fluoroscopy to confirm orientation of the pelvis and position.      The left hip was then prepped and draped from proximal iliac crest to   mid thigh with shower curtain technique.      Time-out was performed identifying the patient, planned procedure, and   extremity.     An incision was then made 2 cm distal and lateral to the   anterior  superior iliac spine extending over the orientation of the   tensor fascia lata muscle and sharp dissection was carried down to the   fascia of the muscle and protractor placed in the soft tissues.      The fascia was then incised.  The muscle belly was identified and swept   laterally and retractor placed along the superior neck.  Following   cauterization of the circumflex vessels and removing some pericapsular   fat, a second cobra retractor was placed on the inferior neck.  A third   retractor was placed on the anterior acetabulum after elevating the   anterior rectus.  A L-capsulotomy was along the line of the   superior neck to the trochanteric fossa, then extended proximally and   distally.  Tag sutures were placed and the retractors were then placed   intracapsular.  We then identified the trochanteric fossa and   orientation of my neck cut, confirmed this radiographically   and then made a neck osteotomy with the femur on traction.  The femoral   head was removed without difficulty or complication.  Traction was let   off and retractors were placed posterior  and anterior around the   acetabulum.      The labrum and foveal tissue were debrided.  I began reaming with a 45mm   reamer and reamed up to 49mm reamer with good bony bed preparation and a 50mm   cup was chosen.  The final 50mm Pinnacle cup was then impacted under fluoroscopy  to confirm the depth of penetration and orientation with respect to   abduction.  A screw was placed followed by the hole eliminator.  The final   32+4 neutral Altrex liner was impacted with good visualized rim fit.  The cup was positioned anatomically within the acetabular portion of the pelvis.      At this point, the femur was rolled at 80 degrees.  Further capsule was   released off the inferior aspect of the femoral neck.  I then   released the superior capsule proximally.  The hook was placed laterally   along the femur and elevated manually and held in position with the bed   hook.  The leg was then extended and adducted with the leg rolled to 100   degrees of external rotation.  Once the proximal femur was fully   exposed, I used a box osteotome to set orientation.  I then began   broaching with the starting chili pepper broach and passed this by hand and then broached up to 4.  With the 4 broach in place I chose a standard neck to match the components on the other hip and did a trial reduction.  The offset was appropriate, leg lengths   appeared to be equal, confirmed radiographically.   Given these findings, I went ahead and dislocated the hip, repositioned all   retractors and positioned the right hip in the extended and abducted position.  The final 4 standard Tri Lock stem was   chosen and it was impacted down to the level of neck cut.  Based on this   and the trial reduction, a 32+1 delta ceramic ball was chosen and   impacted onto a clean and dry trunnion, and the hip was reduced.  The   hip had been irrigated throughout the case again at this point.  I did   reapproximate the superior capsular leaflet  to the anterior leaflet   using #1 Vicryl.  The fascia of the   tensor fascia lata muscle was then reapproximated using #1 Vicryl and #0 V-lock sutures.  The   remaining wound was closed with 2-0 Vicryl and running 4-0 Monocryl.   The hip was cleaned, dried, and dressed sterilely using Dermabond and   Aquacel dressing.  She was then brought   to recovery room in stable condition tolerating the procedure well.    Skip MayerBlair Roberts, PA-C was present for the entirety of the case involved from   preoperative positioning, perioperative retractor management, general   facilitation of the case, as well as primary wound closure as assistant.            Madlyn FrankelMatthew D. Charlann Boxerlin, M.D.        03/19/2014 9:38 AM

## 2014-03-19 NOTE — Anesthesia Preprocedure Evaluation (Addendum)
Anesthesia Evaluation  Patient identified by MRN, date of birth, ID band Patient awake    Reviewed: Allergy & Precautions, H&P , NPO status , Patient's Chart, lab work & pertinent test results  Airway Mallampati: II  TM Distance: <3 FB Neck ROM: Full    Dental no notable dental hx.    Pulmonary neg pulmonary ROS,  breath sounds clear to auscultation  Pulmonary exam normal       Cardiovascular hypertension, Pt. on medications Rhythm:Regular Rate:Normal     Neuro/Psych Anxiety negative neurological ROS     GI/Hepatic negative GI ROS, Neg liver ROS,   Endo/Other  Hypothyroidism Morbid obesity  Renal/GU negative Renal ROS  negative genitourinary   Musculoskeletal negative musculoskeletal ROS (+)   Abdominal   Peds negative pediatric ROS (+)  Hematology negative hematology ROS (+)   Anesthesia Other Findings   Reproductive/Obstetrics negative OB ROS                            Anesthesia Physical Anesthesia Plan  ASA: III  Anesthesia Plan: Spinal   Post-op Pain Management:    Induction: Intravenous  Airway Management Planned:   Additional Equipment:   Intra-op Plan:   Post-operative Plan:   Informed Consent: I have reviewed the patients History and Physical, chart, labs and discussed the procedure including the risks, benefits and alternatives for the proposed anesthesia with the patient or authorized representative who has indicated his/her understanding and acceptance.   Dental advisory given  Plan Discussed with: CRNA and Surgeon  Anesthesia Plan Comments:         Anesthesia Quick Evaluation

## 2014-03-19 NOTE — Plan of Care (Signed)
Problem: Phase I Progression Outcomes Goal: Hemodynamically stable Outcome: Completed/Met Date Met:  03/19/14     

## 2014-03-19 NOTE — Anesthesia Postprocedure Evaluation (Signed)
  Anesthesia Post-op Note  Patient: Shelley Taylor  Procedure(s) Performed: Procedure(s) (LRB): LEFT TOTAL HIP ARTHROPLASTY ANTERIOR APPROACH (Left)  Patient Location: PACU  Anesthesia Type: Spinal  Level of Consciousness: awake and alert   Airway and Oxygen Therapy: Patient Spontanous Breathing  Post-op Pain: mild  Post-op Assessment: Post-op Vital signs reviewed, Patient's Cardiovascular Status Stable, Respiratory Function Stable, Patent Airway and No signs of Nausea or vomiting  Last Vitals:  Filed Vitals:   03/19/14 1039  BP:   Pulse: 63  Temp:   Resp: 9    Post-op Vital Signs: stable   Complications: No apparent anesthesia complications

## 2014-03-20 ENCOUNTER — Encounter (HOSPITAL_COMMUNITY): Payer: Self-pay | Admitting: Orthopedic Surgery

## 2014-03-20 LAB — BASIC METABOLIC PANEL
Anion gap: 9 (ref 5–15)
BUN: 13 mg/dL (ref 6–23)
CALCIUM: 8.4 mg/dL (ref 8.4–10.5)
CO2: 27 mEq/L (ref 19–32)
CREATININE: 0.73 mg/dL (ref 0.50–1.10)
Chloride: 105 mEq/L (ref 96–112)
GFR calc non Af Amer: 89 mL/min — ABNORMAL LOW (ref >90)
Glucose, Bld: 153 mg/dL — ABNORMAL HIGH (ref 70–99)
Potassium: 4.4 mEq/L (ref 3.7–5.3)
Sodium: 141 mEq/L (ref 137–147)

## 2014-03-20 LAB — CBC
HEMATOCRIT: 32.8 % — AB (ref 36.0–46.0)
Hemoglobin: 11.2 g/dL — ABNORMAL LOW (ref 12.0–15.0)
MCH: 31.8 pg (ref 26.0–34.0)
MCHC: 34.1 g/dL (ref 30.0–36.0)
MCV: 93.2 fL (ref 78.0–100.0)
Platelets: 148 10*3/uL — ABNORMAL LOW (ref 150–400)
RBC: 3.52 MIL/uL — ABNORMAL LOW (ref 3.87–5.11)
RDW: 12.6 % (ref 11.5–15.5)
WBC: 8.1 10*3/uL (ref 4.0–10.5)

## 2014-03-20 MED ORDER — METHOCARBAMOL 500 MG PO TABS: 500.0000 mg | ORAL_TABLET | Freq: Four times a day (QID) | ORAL | Status: AC | PRN

## 2014-03-20 MED ORDER — DSS 100 MG PO CAPS: 100.0000 mg | ORAL_CAPSULE | Freq: Two times a day (BID) | ORAL | Status: AC

## 2014-03-20 MED ORDER — POLYETHYLENE GLYCOL 3350 17 G PO PACK: 17.0000 g | PACK | Freq: Two times a day (BID) | ORAL | Status: AC

## 2014-03-20 MED ORDER — FERROUS SULFATE 325 (65 FE) MG PO TABS: 325.0000 mg | ORAL_TABLET | Freq: Three times a day (TID) | ORAL | Status: AC

## 2014-03-20 MED ORDER — HYDROCODONE-ACETAMINOPHEN 7.5-325 MG PO TABS: 1.0000 | ORAL_TABLET | ORAL | Status: AC | PRN

## 2014-03-20 MED ORDER — ASPIRIN 325 MG PO TBEC: 325.0000 mg | DELAYED_RELEASE_TABLET | Freq: Two times a day (BID) | ORAL | Status: AC

## 2014-03-20 NOTE — Progress Notes (Signed)
OT Cancellation Note  Patient Details Name: Shelley Taylor MRN: [id number] DOB: 11/30/1950   Cancelled Treatment:    Reason Eval/Treat Not Completed: OT screened, no needs identified, will sign off.  Pt has assistance and DME.  She had other hip done in '09  [name] 03/20/2014, 11:00 AM  Marica OtterMaryellen Silveria Botz, OTR/L [phone] 03/20/2014

## 2014-03-20 NOTE — Progress Notes (Signed)
     Subjective: 1 Day Post-Op Procedure(s) (LRB): LEFT TOTAL HIP ARTHROPLASTY ANTERIOR APPROACH (Left)   Patient reports pain as mild, pain controlled. Little rough yesterday, but much better this morning. Ready to be discharged home.  Objective:   VITALS:   Filed Vitals:   03/20/14 0627  BP: 107/53  Pulse: 73  Temp: 98.2 F (36.8 C)  Resp: 16    Dorsiflexion/Plantar flexion intact Incision: dressing C/D/I No cellulitis present Compartment soft  LABS  Recent Labs  03/20/14 0404  HGB 11.2*  HCT 32.8*  WBC 8.1  PLT 148*     Recent Labs  03/20/14 0404  NA 141  K 4.4  BUN 13  CREATININE 0.73  GLUCOSE 153*     Assessment/Plan: 1 Day Post-Op Procedure(s) (LRB): LEFT TOTAL HIP ARTHROPLASTY ANTERIOR APPROACH (Left) Foley cath d/c'ed Advance diet Up with therapy D/C IV fluids Discharge home with home health  [facility]. Follow up with OLIN,Keir Viernes D in 2 weeks.  Contact information:  [facility] [address], [address] [place] [id number] [phone]    Morbid Obesity (BMI >40)  Estimated body mass index is 42.35 kg/(m^2) as calculated from the following:   Height as of this encounter: 5\' 3"  (1.6 m).   Weight as of this encounter: 108.41 kg (239 lb). Patient also counseled that weight may inhibit the healing process Patient counseled that losing weight will help with future health issues      Anastasio AuerbachMatthew S. Shandon Matson   PAC  03/20/2014, 9:24 AM

## 2014-03-20 NOTE — Progress Notes (Signed)
Physical Therapy Treatment Patient Details Name: Lelon PerlaCarol B Nevins MRN: [id number] DOB: 07/10/1950 Today's Date: 03/20/2014    History of Present Illness Pt is a 63 year old female s/p L direct anterior THA    PT Comments    Pt reports pain well controlled and able to ambulate good distance in hallway.  Pt performed exercises as well and provided with HEP.  Pt feels ready for d/c home.  Follow Up Recommendations  Home health PT     Equipment Recommendations  None recommended by PT    Recommendations for Other Services       Precautions / Restrictions Precautions Precautions: None Restrictions Other Position/Activity Restrictions: WBAT    Mobility  Bed Mobility Overal bed mobility: Needs Assistance Bed Mobility: Supine to Sit     Supine to sit: Supervision     General bed mobility comments: verbal cues for self assist  Transfers Overall transfer level: Needs assistance Equipment used: Rolling walker (2 wheeled) Transfers: Sit to/from Stand Sit to Stand: Min guard         General transfer comment: verbal cues for safe technique  Ambulation/Gait Ambulation/Gait assistance: Min guard Ambulation Distance (Feet): 200 Feet Assistive device: Rolling walker (2 wheeled) Gait Pattern/deviations: Step-through pattern;Antalgic     General Gait Details: verbal cues for RW positioning, posture   Stairs            Wheelchair Mobility    Modified Rankin (Stroke Patients Only)       Balance                                    Cognition Arousal/Alertness: Awake/alert Behavior During Therapy: WFL for tasks assessed/performed Overall Cognitive Status: Within Functional Limits for tasks assessed                      Exercises Total Joint Exercises Ankle Circles/Pumps: AROM;Both;15 reps Quad Sets: AROM;Both;15 reps Towel Squeeze: AROM;Both;15 reps Heel Slides: AAROM;Left;15 reps Hip ABduction/ADduction: AAROM;Left;15 reps Long Arc  Quad: AROM;Left;15 reps Marching in Standing: Seated;AROM;Left;15 reps    General Comments        Pertinent Vitals/Pain Pain Assessment: No/denies pain    Home Living                      Prior Function            PT Goals (current goals can now be found in the care plan section) Progress towards PT goals: Progressing toward goals    Frequency  7X/week    PT Plan Current plan remains appropriate    Co-evaluation             End of Session   Activity Tolerance: Patient tolerated treatment well Patient left: with call bell/phone within reach;in chair     Time: [id number] PT Time Calculation (min) (ACUTE ONLY): 25 min  Charges:  $Gait Training: 8-22 mins $Therapeutic Exercise: 8-22 mins                    G Codes:      Sidi Dzikowski,KATHrine E 03/20/2014, 1:55 PM

## 2014-03-20 NOTE — Care Management Note (Signed)
    Page 1 of 2   03/20/2014     11:16:44 AM CARE MANAGEMENT NOTE 03/20/2014  Patient:  Shelley Taylor, Shelley Taylor   Account Number:  [id number]  Date Initiated:  03/20/2014  Documentation initiated by:  [facility]  Subjective/Objective Assessment:   adm: LEFT TOTAL HIP ARTHROPLASTY ANTERIOR APPROACH (Left)     Action/Plan:   discharge planning   Anticipated DC Date:  03/20/2014   Anticipated DC Plan:  [place]  CM consult      [facility] Choice  HOME HEALTH   Choice offered to / List presented to:  C-1 Patient   DME arranged  NA      DME agency  NA     [place] arranged  HH-2 PT      [place]   Status of service:  Completed, signed off Medicare Important Message given?   (If response is "NO", the following Medicare IM given date fields will be blank) Date Medicare IM given:   Medicare IM given by:   Date Additional Medicare IM given:   Additional Medicare IM given by:    Discharge Disposition:  [place]  Per UR Regulation:    If discussed at Long Length of Stay Meetings, dates discussed:    Comments:  03/20/14 09:00 CM met with pt in room to offer choice of home health agency.  pt chooses [facility] to render HHPT. Address and contact information verified with pt and additional contact number is [phone]. Pt states she does not need any DME.  Referral given to [place] rep, Mordecai Rasmussen.  No other CM needs were communicated.  Mariane Masters, BSN, [place].

## 2014-03-22 ENCOUNTER — Encounter (HOSPITAL_COMMUNITY): Payer: Self-pay | Admitting: Orthopedic Surgery

## 2014-03-26 NOTE — Discharge Summary (Signed)
Physician Discharge Summary  Patient ID: Shelley Taylor Bedingfield MRN: [id number] DOB/AGE: 63/05/1950 63 y.o.  Admit date: 03/19/2014 Discharge date: 03/20/2014   Procedures:  Procedure(s) (LRB): LEFT TOTAL HIP ARTHROPLASTY ANTERIOR APPROACH (Left)  Attending Physician:  Dr. Durene RomansMatthew Olin   Admission Diagnoses:   Left hip primary OA / pain  Discharge Diagnoses:  Principal Problem:   S/P left THA, AA Active Problems:   Morbid obesity  Past Medical History  Diagnosis Date  . Hypertension     "white coat syndrome from one HTN episode in 2009, bottomed out after hip surgery 2009"  . Hypercholesteremia     under control  . Hypothyroidism   . Anxiety     occasional  . Depression     occasional-seasonal  . Arthritis   . Complication of anesthesia     blood pressure dropped-maybe from pain medications or anesthesia    HPI: Shelley Taylor Heron, 63 y.o. female, has a history of pain and functional disability in the left hip(s) due to arthritis and patient has failed non-surgical conservative treatments for greater than 12 weeks to include NSAID's and/or analgesics, corticosteriod injections and activity modification. Onset of symptoms was gradual starting < 1 year ago with gradually worsening course since that time.The patient noted prior procedures of the hip to include arthroplasty on the right hip per Dr. Charlann Boxerlin in 2009. Patient currently rates pain in the left hip at 10 out of 10 with activity. Patient has night pain, worsening of pain with activity and weight bearing, trendelenberg gait, pain that interfers with activities of daily living and pain with passive range of motion. Patient has evidence of periarticular osteophytes and joint space narrowing by imaging studies. This condition presents safety issues increasing the risk of falls. There is no current active infection. Risks, benefits and expectations were discussed with the patient. Risks including but not limited to the risk of  anesthesia, blood clots, nerve damage, blood vessel damage, failure of the prosthesis, infection and up to and including death. Patient understand the risks, benefits and expectations and wishes to proceed with surgery.    PCP: No primary care provider on file.   Discharged Condition: good  Hospital Course:  Patient underwent the above stated procedure on 03/19/2014. Patient tolerated the procedure well and brought to the recovery room in good condition and subsequently to the floor.  POD #1 BP: 107/53 ; Pulse: 73 ; Temp: 98.2 F (36.8 C) ; Resp: 16 Patient reports pain as mild, pain controlled. Little rough yesterday, but much better this morning. Ready to be discharged home. Dorsiflexion/plantar flexion intact, incision: dressing C/D/I, no cellulitis present and compartment soft.   LABS  Basename    HGB  11.2  HCT  32.8    Discharge Exam: General appearance: alert, cooperative and no distress Extremities: Homans sign is negative, no sign of DVT, no edema, redness or tenderness in the calves or thighs and no ulcers, gangrene or trophic changes  Disposition: Home with follow up in 2 weeks   Follow-up Information    Follow up with Shelda PalLIN,Kaleem Sartwell D, MD.   Specialty:  Orthopedic Surgery   Contact information:   [address] [place] [address] [phone]       [facility]   Why:  home health physical therapy   Contact information:   [address] [place] [address] [phone]       Discharge Instructions    Call MD / Call  911    Complete by:  As directed   If you experience chest pain or shortness of breath, CALL 911 and be transported to the hospital emergency room.  If you develope a fever above 101 F, pus (white drainage) or increased drainage or redness at the wound, or calf pain, call your surgeon's office.     Change dressing    Complete by:  As directed   Maintain surgical dressing for 10-14 days, or  until follow up in the clinic.     Constipation Prevention    Complete by:  As directed   Drink plenty of fluids.  Prune juice may be helpful.  You may use a stool softener, such as Colace (over the counter) 100 mg twice a day.  Use MiraLax (over the counter) for constipation as needed.     Diet - low sodium heart healthy    Complete by:  As directed      Discharge instructions    Complete by:  As directed   Maintain surgical dressing for 10-14 days, or until follow up in the clinic. Follow up in 2 weeks at [facility]. Call with any questions or concerns.     Increase activity slowly as tolerated    Complete by:  As directed      TED hose    Complete by:  As directed   Use stockings (TED hose) for 2 weeks on both leg(s).  You may remove them at night for sleeping.     Weight bearing as tolerated    Complete by:  As directed   Laterality:  left  Extremity:  Lower             Medication List    STOP taking these medications        acetaminophen 500 MG tablet  Commonly known as:  TYLENOL     ibuprofen 200 MG tablet  Commonly known as:  ADVIL,MOTRIN      TAKE these medications        aspirin 325 MG EC tablet  Take 1 tablet (325 mg total) by mouth 2 (two) times daily.     cholecalciferol 1000 UNITS tablet  Commonly known as:  VITAMIN D  Take 1,000 Units by mouth daily.     citalopram 10 MG tablet  Commonly known as:  CELEXA  Take 10 mg by mouth every evening.     Co Q 10 100 MG Caps  Take 1 capsule by mouth daily.     DSS 100 MG Caps  Take 100 mg by mouth 2 (two) times daily.     ferrous sulfate 325 (65 FE) MG tablet  Take 1 tablet (325 mg total) by mouth 3 (three) times daily after meals.     HYDROcodone-acetaminophen 7.5-325 MG per tablet  Commonly known as:  NORCO  Take 1-2 tablets by mouth every 4 (four) hours as needed for moderate pain.     levothyroxine 50 MCG tablet  Commonly known as:  SYNTHROID, LEVOTHROID  Take 50 mcg by mouth daily  before breakfast.     lisinopril 5 MG tablet  Commonly known as:  PRINIVIL,ZESTRIL  Take 5 mg by mouth at bedtime.     methocarbamol 500 MG tablet  Commonly known as:  ROBAXIN  Take 1 tablet (500 mg total) by mouth every 6 (six) hours as needed for muscle spasms.     Omega 3 1000 MG Caps  Take 1 capsule by mouth 4 (four) times daily -  before meals and at bedtime.     polyethylene glycol packet  Commonly known as:  MIRALAX / GLYCOLAX  Take 17 g by mouth 2 (two) times daily.     pravastatin 40 MG tablet  Commonly known as:  PRAVACHOL  Take 40 mg by mouth daily.         Signed: Anastasio AuerbachMatthew S. Twala Collings   PA-C  03/26/2014, 10:04 AM

## 2015-03-29 IMAGING — DX DG PORTABLE PELVIS
1 series · 1 of 1 positions shown · non-contrast
Comparison: 02/27/2008.

CLINICAL DATA: Postop left hip surgery.

EXAM:
PORTABLE PELVIS 1-2 VIEWS

[pelvis ap]
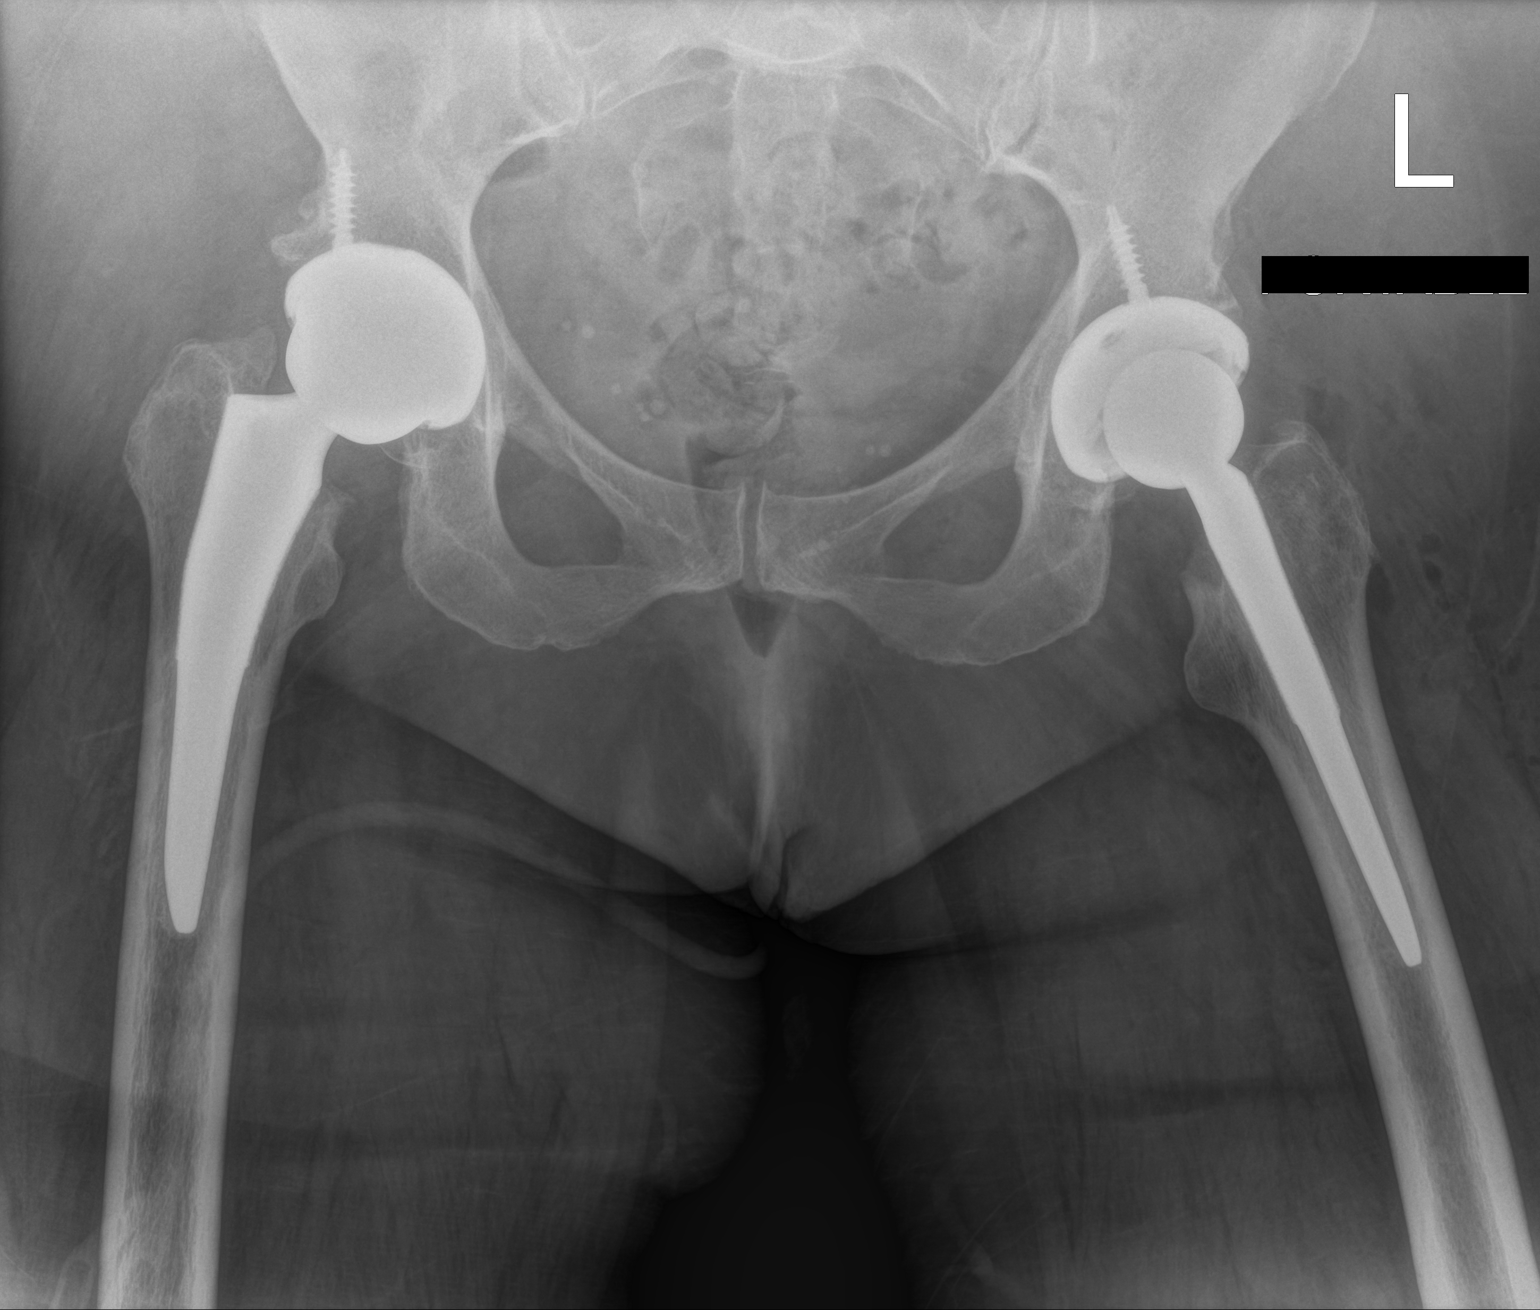

[1 of 1 positions shown; findings below may reference images not displayed]

FINDINGS: Interval left total hip arthroplasty. Femoral stem appears well
seated. Subcutaneous and joint air is noted. Previous right hip
arthroplasty.
IMPRESSION: 1. Interval left total hip arthroplasty with expected postoperative
findings.
2. Prior right hip arthroplasty.

## 2017-04-13 DIAGNOSIS — Z862 Personal history of diseases of the blood and blood-forming organs and certain disorders involving the immune mechanism: Secondary | ICD-10-CM | POA: Diagnosis not present
